# Patient Record
Sex: Female | Born: 1985
Health system: Southern US, Community
[De-identification: ages and names within clinical notes are randomized; demographics above are authoritative.]

## PROBLEM LIST (undated history)

## (undated) DIAGNOSIS — I10 Essential (primary) hypertension: Secondary | ICD-10-CM

## (undated) DIAGNOSIS — G43909 Migraine, unspecified, not intractable, without status migrainosus: Secondary | ICD-10-CM

## (undated) HISTORY — DX: Essential (primary) hypertension: I10

## (undated) HISTORY — DX: Migraine, unspecified, not intractable, without status migrainosus: G43.909

---

## 2011-04-16 ENCOUNTER — Emergency Department (HOSPITAL_COMMUNITY)
Admission: EM | Admit: 2011-04-16 | Discharge: 2011-04-17 | Disposition: A | Discharge status: Home / Self Care | Payer: BC Managed Care – HMO | Attending: Emergency Medicine | Admitting: Emergency Medicine

## 2011-04-16 DIAGNOSIS — B9689 Other specified bacterial agents as the cause of diseases classified elsewhere: Secondary | ICD-10-CM | POA: Insufficient documentation

## 2011-04-16 DIAGNOSIS — A499 Bacterial infection, unspecified: Secondary | ICD-10-CM | POA: Insufficient documentation

## 2011-04-16 DIAGNOSIS — N760 Acute vaginitis: Secondary | ICD-10-CM | POA: Insufficient documentation

## 2011-04-16 DIAGNOSIS — N72 Inflammatory disease of cervix uteri: Secondary | ICD-10-CM | POA: Insufficient documentation

## 2011-04-16 LAB — URINE MICROSCOPIC-ADD ON

## 2011-04-16 LAB — URINALYSIS, ROUTINE W REFLEX MICROSCOPIC
Glucose, UA: NEGATIVE mg/dL
Hgb urine dipstick: NEGATIVE
Ketones, ur: 15 mg/dL — AB
Nitrite: NEGATIVE
Protein, ur: 30 mg/dL — AB
Specific Gravity, Urine: 1.02 (ref 1.005–1.030)
Urobilinogen, UA: 2 mg/dL — ABNORMAL HIGH (ref 0.0–1.0)
pH: 8 (ref 5.0–8.0)

## 2011-04-16 LAB — POCT PREGNANCY, URINE: Preg Test, Ur: NEGATIVE

## 2011-04-17 LAB — URINE CULTURE
Colony Count: >=100000
Culture  Setup Time: 201204170119

## 2011-04-17 LAB — WET PREP, GENITAL
Trich, Wet Prep: NONE SEEN
Yeast Wet Prep HPF POC: NONE SEEN

## 2011-04-17 LAB — GC/CHLAMYDIA PROBE AMP, GENITAL
Chlamydia, DNA Probe: NEGATIVE
GC Probe Amp, Genital: NEGATIVE

## 2012-12-26 ENCOUNTER — Emergency Department (INDEPENDENT_AMBULATORY_CARE_PROVIDER_SITE_OTHER)
Admission: EM | Admit: 2012-12-26 | Discharge: 2012-12-26 | Disposition: A | Discharge status: Home / Self Care | Payer: BC Managed Care – PPO | Source: Home / Self Care | Attending: Emergency Medicine | Admitting: Emergency Medicine

## 2012-12-26 ENCOUNTER — Encounter (HOSPITAL_COMMUNITY): Payer: Self-pay

## 2012-12-26 DIAGNOSIS — G43909 Migraine, unspecified, not intractable, without status migrainosus: Secondary | ICD-10-CM

## 2012-12-26 MED ORDER — DEXAMETHASONE SODIUM PHOSPHATE 10 MG/ML IJ SOLN
10.0000 mg | Freq: Once | INTRAMUSCULAR | Status: AC
  Administered 2012-12-26: 10 mg via INTRAMUSCULAR

## 2012-12-26 MED ORDER — DIPHENHYDRAMINE HCL 50 MG/ML IJ SOLN
INTRAMUSCULAR | Status: AC
  Filled 2012-12-26: qty 1

## 2012-12-26 MED ORDER — SUMATRIPTAN SUCCINATE 50 MG PO TABS: 50.0000 mg | ORAL_TABLET | ORAL | Status: DC | PRN

## 2012-12-26 MED ORDER — DIPHENHYDRAMINE HCL 50 MG/ML IJ SOLN
25.0000 mg | Freq: Once | INTRAMUSCULAR | Status: AC
  Administered 2012-12-26: 25 mg via INTRAMUSCULAR

## 2012-12-26 MED ORDER — KETOROLAC TROMETHAMINE 60 MG/2ML IM SOLN
INTRAMUSCULAR | Status: AC
  Filled 2012-12-26: qty 2

## 2012-12-26 MED ORDER — DEXAMETHASONE SODIUM PHOSPHATE 10 MG/ML IJ SOLN
INTRAMUSCULAR | Status: AC
  Filled 2012-12-26: qty 1

## 2012-12-26 MED ORDER — KETOROLAC TROMETHAMINE 60 MG/2ML IM SOLN
60.0000 mg | Freq: Once | INTRAMUSCULAR | Status: AC
  Administered 2012-12-26: 60 mg via INTRAMUSCULAR

## 2012-12-26 MED ORDER — PREDNISONE 5 MG PO KIT: 1.0000 | PACK | Freq: Every day | ORAL | Status: DC

## 2012-12-26 NOTE — ED Provider Notes (Signed)
Chief Complaint  Patient presents with  . Headache    History of Present Illness:   The patient is a 26 year old female who has had a six-day history of bilateral, severe, throbbing headache rated 10 over 10 in intensity. This has been associated with nausea, vomiting, photophobia, and phonophobia, but not osmophobia. She has some visual spots but no diplopia or blurred vision. She feels achy all over and chills but denies any fever stiff neck. She's had no back pain. She has had headaches in the past but never been diagnosed with migraines. She denies any numbness, tingling, muscle weakness, difficulty with speech or ambulation. This headache came on after she ate some seasoned food that her boyfriend had prepared.  Review of Systems:  Other than noted above, the patient denies any of the following symptoms: Systemic:  No fever, chills, fatigue, photophobia, stiff neck. Eye:  No redness, eye pain, discharge, blurred vision, or diplopia. ENT:  No nasal congestion, rhinorrhea, sinus pressure or pain, sneezing, earache, or sore throat.  No jaw claudication. Neuro:  No paresthesias, loss of consciousness, seizure activity, muscle weakness, trouble with coordination or gait, trouble speaking or swallowing. Psych:  No depression, anxiety or trouble sleeping.  PMFSH:  Past medical history, family history, social history, meds, and allergies were reviewed.  Physical Exam:   Vital signs:  BP 130/88  Pulse 120  Temp 98.5 F (36.9 C) (Oral)  Resp 16  SpO2 99%  LMP 12/15/2012 General:  Alert and oriented.  In no distress. Eye:  Lids and conjunctivas normal.  PERRL,  Full EOMs.  Fundi benign with normal discs and vessels. ENT:  No cranial or facial tenderness to palpation.  TMs and canals clear.  Nasal mucosa was normal and uncongested without any drainage. No intra oral lesions, pharynx clear, mucous membranes moist, dentition normal. Neck:  Supple, full ROM, no tenderness to palpation.  No  adenopathy or mass. Neuro:  Alert and orented times 3.  Speech was clear, fluent, and appropriate.  Cranial nerves intact. No pronator drift, muscle strength normal. Finger to nose normal.  DTRs were 2+ and symmetrical.Station and gait were normal.  Romberg's sign was normal.  Able to perform tandem gait well. Psych:  Normal affect.  Medications given in [facility]:  She was given Decadron 10 mg IM, Toradol 60 mg IM, and Benadryl 25 mg IM. She tolerated this well without any immediate side effects.  Assessment:  The encounter diagnosis was Migraine headache.  Plan:   1.  The following meds were prescribed:   New Prescriptions   PREDNISONE 5 MG KIT    Take 1 kit (5 mg total) by mouth daily after breakfast. Prednisone 5 mg 6 day dosepack.  Take as directed.   SUMATRIPTAN (IMITREX) 50 MG TABLET    Take 1 tablet (50 mg total) by mouth every 2 (two) hours as needed for migraine.   2.  The patient was instructed in symptomatic care and handouts were given. 3.  The patient was told to return if becoming worse in any way, if no better in 3 or 4 days, and given some red flag symptoms that would indicate earlier return.    Reuben Likes, MD 12/26/12 2136

## 2012-12-26 NOTE — ED Notes (Signed)
HA for past 6 days, after eating seasoned food that her boyfriend prepared; no relief w OTC medications

## 2013-07-08 ENCOUNTER — Encounter: Payer: Self-pay | Admitting: Neurology

## 2013-07-08 ENCOUNTER — Ambulatory Visit (INDEPENDENT_AMBULATORY_CARE_PROVIDER_SITE_OTHER): Payer: BC Managed Care – PPO | Admitting: Neurology

## 2013-07-08 VITALS — BP 115/85 | HR 82 | Temp 98.3°F | Ht 59.0 in | Wt 153.0 lb

## 2013-07-08 DIAGNOSIS — G43509 Persistent migraine aura without cerebral infarction, not intractable, without status migrainosus: Secondary | ICD-10-CM

## 2013-07-08 DIAGNOSIS — R209 Unspecified disturbances of skin sensation: Secondary | ICD-10-CM

## 2013-07-08 DIAGNOSIS — G43909 Migraine, unspecified, not intractable, without status migrainosus: Secondary | ICD-10-CM

## 2013-07-08 DIAGNOSIS — R200 Anesthesia of skin: Secondary | ICD-10-CM

## 2013-07-08 HISTORY — DX: Migraine, unspecified, not intractable, without status migrainosus: G43.909

## 2013-07-08 MED ORDER — AMITRIPTYLINE HCL 25 MG PO TABS: ORAL_TABLET | ORAL | Status: DC

## 2013-07-08 NOTE — Progress Notes (Signed)
Subjective:    Patient ID: Jillian Sanchez is a 27 y.o. female.  HPI  Huston Foley, MD, PhD [facility] [address] [address] [place], [place] [address]  Dear Jillian Sanchez,   I saw your patient, Jillian Sanchez, upon your kind request in my neurologic clinic today for initial consultation of her right facial numbness. The patient is unaccompanied today. As you know, Jillian Sanchez is a very pleasant 27 year old right-handed woman with a benign medical history, who has been experiencing right facial numbness for the past few weeks. The numbness in constant and started in the R frontal are and mid face and now has included her R lateral neck area. No pain is reported, no paresthesias, no facial weakness. She does have a Hx of migraines, which started in 12/13 and treated with Imitrex and prednisone dose pack at the time. Interestingly, she reports having had a recurrent HA several days before the numbness started, and she had HA every other day. This started in mid-June. She has had no other one-sided weakness or numbness elsewhere. Never had TIA or stroke symptoms, denying sudden onset of one sided weakness, numbness, tingling, slurring of speech or droopy face, hearing loss, tinnitus, diplopia or visual field cut or monocular loss of vision, and denies recurrent headaches. She has no FHx of stroke or lupus and has one MA with migraines.   She reports a bilateral headache, which is described as intermittent and as a throbbing sensation. Associated with nausea, rare vomiting and accompanied by photo/sonophobia. She has been taking Excedrin for her HA. She had HA yesterday. She denies any additional stress. She has not been sleeping well.   Her Past Medical History Is Significant For: Past Medical History  Diagnosis Date  . Migraine, unspecified, without mention of intractable migraine without mention of status migrainosus 07/08/2013    Her Past Surgical History Is  Significant For: History reviewed. No pertinent past surgical history.  Her Family History Is Significant For: History reviewed. No pertinent family history.  Her Social History Is Significant For: History   Social History  . Marital Status: Single    Spouse Name: N/A    Number of Children: 0  . Years of Education: HS   Occupational History  .  Other    [employer]   Social History Main Topics  . Smoking status: Never Smoker   . Smokeless tobacco: Never Used  . Alcohol Use: Yes     Comment: socially  . Drug Use: No  . Sexually Active: None   Other Topics Concern  . None   Social History Narrative   Pt lives at home with friend.    Caffeine Use: 1 16oz soda daily.    Her Allergies Are:  No Known Allergies:   Her Current Medications Are:  Outpatient Encounter Prescriptions as of 07/08/2013  Medication Sig Dispense Refill  . Norethin Ace-Eth Estrad-FE (MINASTRIN 24 FE PO) Take 1 tablet by mouth daily.      . PredniSONE 5 MG KIT Take 1 kit (5 mg total) by mouth daily after breakfast. Prednisone 5 mg 6 day dosepack.  Take as directed.  1 kit  0  . SUMAtriptan (IMITREX) 50 MG tablet Take 1 tablet (50 mg total) by mouth every 2 (two) hours as needed for migraine.  10 tablet  0   No facility-administered encounter medications on file as of 07/08/2013.   Review of Systems  Neurological: Positive for numbness and headaches.  Psychiatric/Behavioral: Positive  for sleep disturbance (sleepiness).    Objective:  Neurologic Exam  Physical Exam Physical Examination:   Filed Vitals:   07/08/13 0953  BP: 115/85  Pulse: 82  Temp: 98.3 F (36.8 C)    General Examination: The patient is a very pleasant 27 y.o. female in no acute distress. She appears well-developed and well-nourished and well groomed.   HEENT: Normocephalic, atraumatic, pupils are equal, round and reactive to light and accommodation. Funduscopic exam is normal with sharp disc margins noted. Extraocular tracking  is good without limitation to gaze excursion or nystagmus noted. Normal smooth pursuit is noted. Hearing is grossly intact. Tympanic membranes are clear bilaterally. Face is symmetric with normal facial animation and normal facial strength, but she does have a known decrease in pinprick sensation, temperature sensation and light touch sensation in the entire right hemi-face as well as below her jaw line and into the right lateral neck area. Speech is clear with no dysarthria noted. There is no hypophonia. There is no lip, neck/head, jaw or voice tremor. Neck is supple with full range of passive and active motion. There are no carotid bruits on auscultation. Oropharynx exam reveals: mild mouth dryness, good dental hygiene and mild airway crowding, due to narrow airway. Mallampati is class II. Tongue protrudes centrally and palate elevates symmetrically.   Chest: Clear to auscultation without wheezing, rhonchi or crackles noted.  Heart: S1+S2+0, regular and normal without murmurs, rubs or gallops noted.   Abdomen: Soft, non-tender and non-distended with normal bowel sounds appreciated on auscultation.  Extremities: There is no pitting edema in the distal lower extremities bilaterally. Pedal pulses are intact.  Skin: Warm and dry without trophic changes noted. There are no varicose veins.  Musculoskeletal: exam reveals no obvious joint deformities, tenderness or joint swelling or erythema.   Neurologically:  Mental status: The patient is awake, alert and oriented in all 4 spheres. Her memory, attention, language and knowledge are appropriate. There is no aphasia, agnosia, apraxia or anomia. Speech is clear with normal prosody and enunciation. Thought process is linear. Mood is congruent and affect is normal.  Cranial nerves are as described above under HEENT exam. In addition, shoulder shrug is normal with equal shoulder height noted. Motor exam: Normal bulk, strength and tone is noted. There is no  drift, tremor or rebound. Romberg is negative. Reflexes are 2+ throughout. Toes are downgoing bilaterally. Fine motor skills are intact with normal finger taps, normal hand movements, normal rapid alternating patting, normal foot taps and normal foot agility.  Cerebellar testing shows no dysmetria or intention tremor on finger to nose testing. Heel to shin is unremarkable bilaterally. There is no truncal or gait ataxia.  Sensory exam is intact to light touch, pinprick, vibration, temperature sense and proprioception in the upper and lower extremities.  Gait, station and balance are unremarkable. No veering to one side is noted. No leaning to one side is noted. Posture is age-appropriate and stance is narrow based. No problems turning are noted. She turns en bloc. Tandem walk is unremarkable. Intact toe and heel stance is noted.               Assessment and Plan:   Assessment and Plan:  In summary, Jillian Sanchez is a very pleasant 27 y.o.-year old female with a history of migraine headaches. Her physical exam is non-focal, with the exception of mild sensory deficit of her right hemi-face in the V1 through V3 area. No weakness is detected. She has been having recurrent  migrainous headaches. Most likely this is a migraine related phenomenon. Some people have migraine auras in the absence of the associated headaches and she has been having recurrent migraine attacks recently. Nevertheless, at this time I would like to go ahead with a brain MRI with and without contrast and additional blood work. For her recurrent headaches and numbness I would like to try her on Elavil starting at 12.5 mg each night. We will gradually bring this up to up to 50 mg each night. I did caution her about potential side effects including sedation and mouth dryness, confusion. She understood and was in agreement. I will see her back in about 3 months, sooner if the need arises, depending on her test results as well. She is advised that  we will be calling her for her test results and I provided her with a new prescription for Elavil.   Thank you very much for allowing me to participate in the care of this nice patient. If I can be of any further assistance to you please do not hesitate to call me at [phone].  Sincerely,   Huston Foley, MD, PhD

## 2013-07-08 NOTE — Patient Instructions (Addendum)
I think overall you are doing fairly well but I do want to suggest a few things today:  Remember to drink plenty of fluid, eat healthy meals and do not skip any meals. Try to eat protein with a every meal and eat a healthy snack such as fruit or nuts in between meals. Try to keep a regular sleep-wake schedule and try to exercise daily, particularly in the form of walking, 20-30 minutes a day, if you can.   Engage in social activities in your community and with your family and try to keep up with current events by reading the newspaper or watching the news.   As far as your medications are concerned, I would like to suggest trial of Elavil (amitriptyline) 25 mg: Take half a pill daily at bedtime for one week, then one pill daily at bedtime for one week, then one and a half pills daily at bedtime for one week, then 2 pills daily at bedtime thereafter. Common side effects reported are: mouth dryness, drowsiness, confusion, dizziness.   As far as diagnostic testing: MRI brain with and without contrast. We will also do blood work today.  I would like to see you back in 3 months, sooner if we need to. Please call us with any interim questions, concerns, problems, updates or refill requests.  Please also call us for any test results so we can go over those with you on the phone. Brett Canales is my clinical assistant and will answer any of your questions and relay your messages to me and also relay most of my messages to you.  Our phone number is [phone]. We also have an after hours call service for urgent matters and there is a physician on-call for urgent questions. For any emergencies you know to call 911 or go to the nearest emergency room.

## 2013-07-09 LAB — COMPREHENSIVE METABOLIC PANEL
ALT: 46 IU/L — ABNORMAL HIGH (ref 0–32)
AST: 30 IU/L (ref 0–40)
Albumin/Globulin Ratio: 1.7 (ref 1.1–2.5)
Albumin: 4.5 g/dL (ref 3.5–5.5)
Alkaline Phosphatase: 70 IU/L (ref 39–117)
BUN/Creatinine Ratio: 12 (ref 8–20)
BUN: 12 mg/dL (ref 6–20)
CO2: 25 mmol/L (ref 18–29)
Calcium: 9 mg/dL (ref 8.7–10.2)
Chloride: 100 mmol/L (ref 97–108)
Creatinine, Ser: 1.01 mg/dL — ABNORMAL HIGH (ref 0.57–1.00)
GFR calc Af Amer: 89 mL/min/{1.73_m2} (ref >59)
GFR calc non Af Amer: 77 mL/min/{1.73_m2} (ref >59)
Globulin, Total: 2.7 g/dL (ref 1.5–4.5)
Glucose: 72 mg/dL (ref 65–99)
Potassium: 4.3 mmol/L (ref 3.5–5.2)
Sodium: 139 mmol/L (ref 134–144)
Total Bilirubin: 0.4 mg/dL (ref 0.0–1.2)
Total Protein: 7.2 g/dL (ref 6.0–8.5)

## 2013-07-09 LAB — VITAMIN D 25 HYDROXY (VIT D DEFICIENCY, FRACTURES): Vit D, 25-Hydroxy: 6.3 ng/mL — ABNORMAL LOW (ref 30.0–100.0)

## 2013-07-09 LAB — B12 AND FOLATE PANEL
Folate: 6.3 ng/mL (ref >3.0)
Vitamin B-12: 389 pg/mL (ref 211–946)

## 2013-07-09 LAB — C-REACTIVE PROTEIN: CRP: 3 mg/L (ref 0.0–4.9)

## 2013-07-09 LAB — ANA W/REFLEX: Anti Nuclear Antibody(ANA): NEGATIVE

## 2013-07-09 LAB — SEDIMENTATION RATE: Sed Rate: 3 mm/hr (ref 0–32)

## 2013-07-09 LAB — HGB A1C W/O EAG: Hgb A1c MFr Bld: 5.4 % (ref 4.8–5.6)

## 2013-07-09 LAB — TSH: TSH: 1.09 u[IU]/mL (ref 0.450–4.500)

## 2013-07-13 ENCOUNTER — Telehealth: Payer: Self-pay

## 2013-07-13 NOTE — Progress Notes (Signed)
Quick Note:  Please call and advise the patient that the recent labs we checked were within normal limits, except for low vitamin D level. Please have her discuss with her PCP which supplement to take for her Vitamin D, her level was 6.3 and the low level for normal is 30. Otherwise, blood sugar, electrolytes, kidney function, liver function, thyroid function, inflammatory markers, sedimentation rate, were normals with the exception of one of her liver enzymes called ALT mildly elevated. This is a nonspecific finding and may indicate being on chronic medicines. No further action is required on these tests at this time other than addressing the low vitamin D. Please remind patient to keep any upcoming appointments or tests and to call us with any interim questions, concerns, problems or updates. Thanks,  Huston Foley, MD, PhD    ______

## 2013-07-13 NOTE — Telephone Encounter (Signed)
I called patient and left VM that her labs were normal except her vitamin D. She can speak with her primary doctor about what supplement he recommends. I left info on what her lab was and what normal is. I also left message that ALT is slightly abnormal but that is seen in patients who are on medication for a long time and is not of concern for Dr. Frances Furbish. I requested that patient call and speak with a nurse if she has any questions or concerns [phone]

## 2013-07-13 NOTE — Telephone Encounter (Signed)
Message copied by [facility] on Mon Jul 13, 2013  3:21 PM ------      Message from: Huston Foley      Created: Mon Jul 13, 2013 10:48 AM       Please call and advise the patient that the recent labs we checked were within normal limits, except for low vitamin D level. Please have her discuss with her PCP which supplement to take for her Vitamin D, her level was 6.3 and the low level for normal is 30. Otherwise, blood sugar, electrolytes, kidney function, liver function, thyroid function, inflammatory markers, sedimentation rate, were normals with the exception of one of her liver enzymes called ALT mildly elevated. This is a nonspecific finding and may indicate being on chronic medicines. No further action is required on these tests at this time other than addressing the low vitamin D. Please remind patient to keep any upcoming appointments or tests and to call us with any interim questions, concerns, problems or updates. Thanks,       Huston Foley, MD, PhD                   ------

## 2013-07-23 ENCOUNTER — Other Ambulatory Visit: Payer: BC Managed Care – PPO

## 2013-07-30 ENCOUNTER — Ambulatory Visit (INDEPENDENT_AMBULATORY_CARE_PROVIDER_SITE_OTHER): Payer: BC Managed Care – PPO

## 2013-07-30 DIAGNOSIS — R209 Unspecified disturbances of skin sensation: Secondary | ICD-10-CM

## 2013-07-30 DIAGNOSIS — G43509 Persistent migraine aura without cerebral infarction, not intractable, without status migrainosus: Secondary | ICD-10-CM

## 2013-07-30 DIAGNOSIS — R200 Anesthesia of skin: Secondary | ICD-10-CM

## 2013-07-30 DIAGNOSIS — G43909 Migraine, unspecified, not intractable, without status migrainosus: Secondary | ICD-10-CM

## 2013-07-30 MED ORDER — GADOPENTETATE DIMEGLUMINE 469.01 MG/ML IV SOLN: 14.0000 mL | Freq: Once | INTRAVENOUS | Status: AC | PRN

## 2013-07-31 NOTE — Progress Notes (Signed)
Quick Note:  Please call and advise the patient that the recent scan we did was within normal limits. We did a brain MRI with and without contrast, which showed normal findings. In particular, there were no acute findings, such as a stroke, or mass or blood products. No further action is required on this test at this time. Please remind patient to keep any upcoming appointments or tests and to call us with any interim questions, concerns, problems or updates. Thanks,  Koray Soter, MD, PhD   ______ 

## 2013-07-31 NOTE — Progress Notes (Signed)
Quick Note:  Left a message on the pt's home voice mail (vm was in the patient's voice and she stated her name) regarding her recent MRI being within normal limits. Contact information was given so that she may call with any questions or concerns.   ______

## 2013-10-14 ENCOUNTER — Ambulatory Visit: Payer: BC Managed Care – PPO | Admitting: Neurology

## 2014-01-05 ENCOUNTER — Encounter (HOSPITAL_COMMUNITY): Payer: Self-pay | Admitting: Emergency Medicine

## 2014-01-05 ENCOUNTER — Emergency Department (HOSPITAL_COMMUNITY)
Admission: EM | Admit: 2014-01-05 | Discharge: 2014-01-05 | Disposition: A | Discharge status: Home / Self Care | Payer: BC Managed Care – PPO | Source: Home / Self Care | Attending: Family Medicine | Admitting: Family Medicine

## 2014-01-05 DIAGNOSIS — J020 Streptococcal pharyngitis: Secondary | ICD-10-CM

## 2014-01-05 LAB — POCT RAPID STREP A: Streptococcus, Group A Screen (Direct): POSITIVE — AB

## 2014-01-05 MED ORDER — METHYLPREDNISOLONE (PAK) 4 MG PO TABS: ORAL_TABLET | ORAL | Status: DC

## 2014-01-05 MED ORDER — TRAMADOL HCL 50 MG PO TABS: 50.0000 mg | ORAL_TABLET | Freq: Four times a day (QID) | ORAL | Status: DC | PRN

## 2014-01-05 MED ORDER — AMOXICILLIN 875 MG PO TABS: 875.0000 mg | ORAL_TABLET | Freq: Two times a day (BID) | ORAL | Status: DC

## 2014-01-05 NOTE — ED Notes (Signed)
Pt reports having "flu like symptoms" since Sunday. Pt denies fever, nausea, or vomiting.

## 2014-01-05 NOTE — ED Provider Notes (Signed)
CSN: [id number]     Arrival date & time 01/05/14  1112 History   First MD Initiated Contact with Patient 01/05/14 1313     Chief Complaint  Patient presents with  . Influenza   (Consider location/radiation/quality/duration/timing/severity/associated sxs/prior Treatment) HPI Comments: 28 year old female presents complaining of possible influenza. Since Sunday 2 days ago, she has had a Severe sore throat, body aches, headache, runny nose, mild cough. The sore throat has been worsening and is now very severe. She also admits to some mild nausea without vomiting. She says the cough is very mild, dry, infrequent, and started only yesterday. She has no recent travel or sick contacts.   Past Medical History  Diagnosis Date  . Migraine, unspecified, without mention of intractable migraine without mention of status migrainosus 07/08/2013   History reviewed. No pertinent past surgical history. History reviewed. No pertinent family history. History  Substance Use Topics  . Smoking status: Never Smoker   . Smokeless tobacco: Never Used  . Alcohol Use: Yes     Comment: socially   OB History   Grav Para Term Preterm Abortions TAB SAB Ect Mult Living                 Review of Systems  Constitutional: Positive for fever, chills and fatigue.  HENT: Positive for congestion, rhinorrhea and sore throat.   Eyes: Negative for visual disturbance.  Respiratory: Positive for cough. Negative for shortness of breath.   Cardiovascular: Negative for chest pain, palpitations and leg swelling.  Gastrointestinal: Positive for nausea. Negative for vomiting and abdominal pain.  Endocrine: Negative for polydipsia and polyuria.  Genitourinary: Negative for dysuria, urgency and frequency.  Musculoskeletal: Positive for arthralgias and myalgias.  Skin: Negative for rash.  Neurological: Positive for headaches. Negative for dizziness, weakness and light-headedness.    Allergies  Review of patient's allergies  indicates no known allergies.  Home Medications   Current Outpatient Rx  Name  Route  Sig  Dispense  Refill  . amitriptyline (ELAVIL) 25 MG tablet      1/2 pill each bedtime x 1 week, then 1 pill nightly x 1 week, then 1 1/2 pills nightly x 1 week, then 2 pills nightly thereafter.   60 tablet   3   . amoxicillin (AMOXIL) 875 MG tablet   Oral   Take 1 tablet (875 mg total) by mouth 2 (two) times daily.   14 tablet   0   . methylPREDNIsolone (MEDROL DOSPACK) 4 MG tablet      follow package directions   21 tablet   0   . Norethin Ace-Eth Estrad-FE (MINASTRIN 24 FE PO)   Oral   Take 1 tablet by mouth daily.         . PredniSONE 5 MG KIT   Oral   Take 1 kit (5 mg total) by mouth daily after breakfast. Prednisone 5 mg 6 day dosepack.  Take as directed.   1 kit   0   . SUMAtriptan (IMITREX) 50 MG tablet   Oral   Take 1 tablet (50 mg total) by mouth every 2 (two) hours as needed for migraine.   10 tablet   0   . traMADol (ULTRAM) 50 MG tablet   Oral   Take 1 tablet (50 mg total) by mouth every 6 (six) hours as needed.   15 tablet   0    BP 136/81  Pulse 94  Temp(Src) 98 F (36.7 C) (Oral)  Resp 18  SpO2 100%  LMP 12/19/2013 Physical Exam  Nursing note and vitals reviewed. Constitutional: She is oriented to person, place, and time. Vital signs are normal. She appears well-developed and well-nourished. No distress.  HENT:  Head: Normocephalic and atraumatic.  Nose: Nose normal. Right sinus exhibits no maxillary sinus tenderness and no frontal sinus tenderness. Left sinus exhibits no maxillary sinus tenderness and no frontal sinus tenderness.  Mouth/Throat: Uvula is midline and mucous membranes are normal. Posterior oropharyngeal erythema present. No oropharyngeal exudate, posterior oropharyngeal edema or tonsillar abscesses.  Neck: Normal range of motion. Neck supple.  Pulmonary/Chest: Effort normal. No respiratory distress.  Lymphadenopathy:    She has  cervical adenopathy (tonsillar and posterior cervical).  Neurological: She is alert and oriented to person, place, and time. She has normal strength. Coordination normal.  Skin: Skin is warm and dry. No rash noted. She is not diaphoretic.  Psychiatric: She has a normal mood and affect. Judgment normal.    ED Course  Procedures (including critical care time) Labs Review Labs Reviewed  POCT RAPID STREP A ([facility] CARE ONLY) - Abnormal; Notable for the following:    Streptococcus, Group A Screen (Direct) POSITIVE (*)    All other components within normal limits   Imaging Review No results found.    MDM   1. Strep pharyngitis    strep positive.  Treat with amoxicillin and symptomatic management. Followup if not improving within a few days  Meds ordered this encounter  Medications  . amoxicillin (AMOXIL) 875 MG tablet    Sig: Take 1 tablet (875 mg total) by mouth 2 (two) times daily.    Dispense:  14 tablet    Refill:  0    Order Specific Question:  Supervising Provider    Answer:  Jake Michaelis, DAVID C [id number]  . methylPREDNIsolone (MEDROL DOSPACK) 4 MG tablet    Sig: follow package directions    Dispense:  21 tablet    Refill:  0    Order Specific Question:  Supervising Provider    Answer:  Jake Michaelis, DAVID C [id number]  . traMADol (ULTRAM) 50 MG tablet    Sig: Take 1 tablet (50 mg total) by mouth every 6 (six) hours as needed.    Dispense:  15 tablet    Refill:  0    Order Specific Question:  Supervising Provider    Answer:  Jake Michaelis, DAVID C [place], PA-C 01/07/14 [phone]

## 2014-01-05 NOTE — Discharge Instructions (Signed)
Sore Throat A sore throat is pain, burning, irritation, or scratchiness of the throat. There is often pain or tenderness when swallowing or talking. A sore throat may be accompanied by other symptoms, such as coughing, sneezing, fever, and swollen neck glands. A sore throat is often the first sign of another sickness, such as a cold, flu, strep throat, or mononucleosis (commonly known as mono). Most sore throats go away without medical treatment. CAUSES  The most common causes of a sore throat include:  A viral infection, such as a cold, flu, or mono.  A bacterial infection, such as strep throat, tonsillitis, or whooping cough.  Seasonal allergies.  Dryness in the air.  Irritants, such as smoke or pollution.  Gastroesophageal reflux disease (GERD). HOME CARE INSTRUCTIONS   Only take over-the-counter medicines as directed by your caregiver.  Drink enough fluids to keep your urine clear or pale yellow.  Rest as needed.  Try using throat sprays, lozenges, or sucking on hard candy to ease any pain (if older than 4 years or as directed).  Sip warm liquids, such as broth, herbal tea, or warm water with honey to relieve pain temporarily. You may also eat or drink cold or frozen liquids such as frozen ice pops.  Gargle with salt water (mix 1 tsp salt with 8 oz of water).  Do not smoke and avoid secondhand smoke.  Put a cool-mist humidifier in your bedroom at night to moisten the air. You can also turn on a hot shower and sit in the bathroom with the door closed for 5 10 minutes. SEEK IMMEDIATE MEDICAL CARE IF:  You have difficulty breathing.  You are unable to swallow fluids, soft foods, or your saliva.  You have increased swelling in the throat.  Your sore throat does not get better in 7 days.  You have nausea and vomiting.  You have a fever or persistent symptoms for more than 2 3 days.  You have a fever and your symptoms suddenly get worse. MAKE SURE YOU:   Understand  these instructions.  Will watch your condition.  Will get help right away if you are not doing well or get worse. Document Released: 01/24/2005 Document Revised: 12/03/2012 Document Reviewed: 08/24/2012 ExitCare Patient Information 2014 [employer].  

## 2014-01-09 NOTE — ED Provider Notes (Signed)
Medical screening examination/treatment/procedure(s) were performed by a resident physician or non-physician practitioner and as the supervising physician I was immediately available for consultation/collaboration.  Fernando Torry, MD    Ewa Hipp S Robet Crutchfield, MD 01/09/14 0849 

## 2014-06-22 ENCOUNTER — Other Ambulatory Visit (HOSPITAL_COMMUNITY)
Admission: RE | Admit: 2014-06-22 | Discharge: 2014-06-22 | Disposition: A | Discharge status: Home / Self Care | Payer: BC Managed Care – PPO | Source: Ambulatory Visit | Attending: Internal Medicine | Admitting: Internal Medicine

## 2014-06-22 ENCOUNTER — Other Ambulatory Visit: Payer: Self-pay | Admitting: Internal Medicine

## 2014-06-22 DIAGNOSIS — Z01419 Encounter for gynecological examination (general) (routine) without abnormal findings: Secondary | ICD-10-CM | POA: Insufficient documentation

## 2014-06-24 LAB — CYTOLOGY - PAP

## 2015-03-30 ENCOUNTER — Emergency Department (HOSPITAL_COMMUNITY)
Admission: EM | Admit: 2015-03-30 | Discharge: 2015-03-30 | Disposition: A | Discharge status: Home / Self Care | Payer: BLUE CROSS/BLUE SHIELD | Source: Home / Self Care | Attending: Family Medicine | Admitting: Family Medicine

## 2015-03-30 ENCOUNTER — Encounter (HOSPITAL_COMMUNITY): Payer: Self-pay | Admitting: Emergency Medicine

## 2015-03-30 DIAGNOSIS — N3001 Acute cystitis with hematuria: Secondary | ICD-10-CM

## 2015-03-30 LAB — POCT URINALYSIS DIP (DEVICE)
Bilirubin Urine: NEGATIVE
Glucose, UA: NEGATIVE mg/dL
Ketones, ur: NEGATIVE mg/dL
Nitrite: NEGATIVE
Protein, ur: NEGATIVE mg/dL
Specific Gravity, Urine: 1.015 (ref 1.005–1.030)
Urobilinogen, UA: 1 mg/dL (ref 0.0–1.0)
pH: 7 (ref 5.0–8.0)

## 2015-03-30 LAB — POCT PREGNANCY, URINE: Preg Test, Ur: NEGATIVE

## 2015-03-30 MED ORDER — CEPHALEXIN 500 MG PO CAPS: 500.0000 mg | ORAL_CAPSULE | Freq: Two times a day (BID) | ORAL | Status: DC

## 2015-03-30 NOTE — ED Provider Notes (Signed)
Jillian Sanchez is a 29 y.o. female who presents to Urgent Care today for urinary frequency urgency and dysuria associated with urine odor. Symptoms present for 3-4 days. No fevers chills nausea vomiting or diarrhea. No treatment tried yet.   Past Medical History  Diagnosis Date  . Migraine, unspecified, without mention of intractable migraine without mention of status migrainosus 07/08/2013   History reviewed. No pertinent past surgical history. History  Substance Use Topics  . Smoking status: Never Smoker   . Smokeless tobacco: Never Used  . Alcohol Use: Yes     Comment: socially   ROS as above Medications: No current facility-administered medications for this encounter.   Current Outpatient Prescriptions  Medication Sig Dispense Refill  . amitriptyline (ELAVIL) 25 MG tablet 1/2 pill each bedtime x 1 week, then 1 pill nightly x 1 week, then 1 1/2 pills nightly x 1 week, then 2 pills nightly thereafter. 60 tablet 3  . amoxicillin (AMOXIL) 875 MG tablet Take 1 tablet (875 mg total) by mouth 2 (two) times daily. 14 tablet 0  . cephALEXin (KEFLEX) 500 MG capsule Take 1 capsule (500 mg total) by mouth 2 (two) times daily. 14 capsule 0  . methylPREDNIsolone (MEDROL DOSPACK) 4 MG tablet follow package directions 21 tablet 0  . Norethin Ace-Eth Estrad-FE (MINASTRIN 24 FE PO) Take 1 tablet by mouth daily.    . PredniSONE 5 MG KIT Take 1 kit (5 mg total) by mouth daily after breakfast. Prednisone 5 mg 6 day dosepack.  Take as directed. 1 kit 0  . SUMAtriptan (IMITREX) 50 MG tablet Take 1 tablet (50 mg total) by mouth every 2 (two) hours as needed for migraine. 10 tablet 0  . traMADol (ULTRAM) 50 MG tablet Take 1 tablet (50 mg total) by mouth every 6 (six) hours as needed. 15 tablet 0   No Known Allergies   Exam:  BP 155/94 mmHg  Pulse 92  Temp(Src) 98.3 F (36.8 C) (Oral)  Resp 16  SpO2 98%  LMP 03/30/2015 Gen: Well NAD HEENT: EOMI,  MMM Lungs: Normal work of breathing. CTABL Heart:  RRR no MRG Abd: NABS, Soft. Nondistended, Nontender no CV angle tenderness to percussion Exts: Brisk capillary refill, warm and well perfused.   Results for orders placed or performed during the hospital encounter of 03/30/15 (from the past 24 hour(s))  POCT urinalysis dip (device)     Status: Abnormal   Collection Time: 03/30/15  5:38 PM  Result Value Ref Range   Glucose, UA NEGATIVE NEGATIVE mg/dL   Bilirubin Urine NEGATIVE NEGATIVE   Ketones, ur NEGATIVE NEGATIVE mg/dL   Specific Gravity, Urine 1.015 1.005 - 1.030   Hgb urine dipstick MODERATE (A) NEGATIVE   pH 7.0 5.0 - 8.0   Protein, ur NEGATIVE NEGATIVE mg/dL   Urobilinogen, UA 1.0 0.0 - 1.0 mg/dL   Nitrite NEGATIVE NEGATIVE   Leukocytes, UA SMALL (A) NEGATIVE   No results found.  Assessment and Plan: 29 y.o. female with urinary tract infection. Treat with Keflex.  Discussed warning signs or symptoms. Please see discharge instructions. Patient expresses understanding.     Gregor Hams, MD 03/30/15 [phone]

## 2015-03-30 NOTE — Discharge Instructions (Signed)
Thank you for coming in today. If your belly pain worsens, or you have high fever, bad vomiting, blood in your stool or black tarry stool go to the Emergency Room.   Urinary Tract Infection Urinary tract infections (UTIs) can develop anywhere along your urinary tract. Your urinary tract is your body's drainage system for removing wastes and extra water. Your urinary tract includes two kidneys, two ureters, a bladder, and a urethra. Your kidneys are a pair of bean-shaped organs. Each kidney is about the size of your fist. They are located below your ribs, one on each side of your spine. CAUSES Infections are caused by microbes, which are microscopic organisms, including fungi, viruses, and bacteria. These organisms are so small that they can only be seen through a microscope. Bacteria are the microbes that most commonly cause UTIs. SYMPTOMS  Symptoms of UTIs may vary by age and gender of the patient and by the location of the infection. Symptoms in young women typically include a frequent and intense urge to urinate and a painful, burning feeling in the bladder or urethra during urination. Older women and men are more likely to be tired, shaky, and weak and have muscle aches and abdominal pain. A fever may mean the infection is in your kidneys. Other symptoms of a kidney infection include pain in your back or sides below the ribs, nausea, and vomiting. DIAGNOSIS To diagnose a UTI, your caregiver will ask you about your symptoms. Your caregiver also will ask to provide a urine sample. The urine sample will be tested for bacteria and white blood cells. White blood cells are made by your body to help fight infection. TREATMENT  Typically, UTIs can be treated with medication. Because most UTIs are caused by a bacterial infection, they usually can be treated with the use of antibiotics. The choice of antibiotic and length of treatment depend on your symptoms and the type of bacteria causing your  infection. HOME CARE INSTRUCTIONS  If you were prescribed antibiotics, take them exactly as your caregiver instructs you. Finish the medication even if you feel better after you have only taken some of the medication.  Drink enough water and fluids to keep your urine clear or pale yellow.  Avoid caffeine, tea, and carbonated beverages. They tend to irritate your bladder.  Empty your bladder often. Avoid holding urine for long periods of time.  Empty your bladder before and after sexual intercourse.  After a bowel movement, women should cleanse from front to back. Use each tissue only once. SEEK MEDICAL CARE IF:   You have back pain.  You develop a fever.  Your symptoms do not begin to resolve within 3 days. SEEK IMMEDIATE MEDICAL CARE IF:   You have severe back pain or lower abdominal pain.  You develop chills.  You have nausea or vomiting.  You have continued burning or discomfort with urination. MAKE SURE YOU:   Understand these instructions.  Will watch your condition.  Will get help right away if you are not doing well or get worse. Document Released: 09/26/2005 Document Revised: 06/17/2012 Document Reviewed: 01/25/2012 ExitCare Patient Information 2015 [employer]. This information is not intended to replace advice given to you by your health care provider. Make sure you discuss any questions you have with your health care provider.  

## 2015-03-30 NOTE — ED Notes (Signed)
C/o cold sx onset 3 days Sx include dysuria, cloudy urine, urinary frequency/urgency, foul odor, abd pain Denies fevers, chills Alert, no signs of acute distress.

## 2017-02-04 DIAGNOSIS — L2084 Intrinsic (allergic) eczema: Secondary | ICD-10-CM | POA: Diagnosis not present

## 2017-05-17 DIAGNOSIS — Z01419 Encounter for gynecological examination (general) (routine) without abnormal findings: Secondary | ICD-10-CM | POA: Diagnosis not present

## 2017-05-17 DIAGNOSIS — Z124 Encounter for screening for malignant neoplasm of cervix: Secondary | ICD-10-CM | POA: Diagnosis not present

## 2018-03-25 DIAGNOSIS — Z23 Encounter for immunization: Secondary | ICD-10-CM | POA: Diagnosis not present

## 2018-03-25 DIAGNOSIS — Z0000 Encounter for general adult medical examination without abnormal findings: Secondary | ICD-10-CM | POA: Diagnosis not present

## 2018-05-24 DIAGNOSIS — M5431 Sciatica, right side: Secondary | ICD-10-CM | POA: Diagnosis not present

## 2018-05-28 ENCOUNTER — Ambulatory Visit
Admission: RE | Admit: 2018-05-28 | Discharge: 2018-05-28 | Disposition: A | Discharge status: Home / Self Care | Payer: 59 | Source: Ambulatory Visit | Attending: Internal Medicine | Admitting: Internal Medicine

## 2018-05-28 ENCOUNTER — Other Ambulatory Visit: Payer: Self-pay | Admitting: Internal Medicine

## 2018-05-28 DIAGNOSIS — M62830 Muscle spasm of back: Secondary | ICD-10-CM | POA: Diagnosis not present

## 2018-05-28 DIAGNOSIS — M5431 Sciatica, right side: Secondary | ICD-10-CM

## 2018-06-10 DIAGNOSIS — K5909 Other constipation: Secondary | ICD-10-CM | POA: Diagnosis not present

## 2018-10-30 ENCOUNTER — Other Ambulatory Visit (HOSPITAL_COMMUNITY)
Admission: RE | Admit: 2018-10-30 | Discharge: 2018-10-30 | Disposition: A | Discharge status: Home / Self Care | Payer: 59 | Source: Ambulatory Visit | Attending: Internal Medicine | Admitting: Internal Medicine

## 2018-10-30 ENCOUNTER — Other Ambulatory Visit: Payer: Self-pay | Admitting: Internal Medicine

## 2018-10-30 DIAGNOSIS — R102 Pelvic and perineal pain: Secondary | ICD-10-CM | POA: Diagnosis not present

## 2018-10-30 DIAGNOSIS — Z01411 Encounter for gynecological examination (general) (routine) with abnormal findings: Secondary | ICD-10-CM | POA: Insufficient documentation

## 2018-10-30 DIAGNOSIS — Z01419 Encounter for gynecological examination (general) (routine) without abnormal findings: Secondary | ICD-10-CM | POA: Diagnosis not present

## 2018-10-30 DIAGNOSIS — N949 Unspecified condition associated with female genital organs and menstrual cycle: Secondary | ICD-10-CM | POA: Diagnosis not present

## 2018-10-30 DIAGNOSIS — N926 Irregular menstruation, unspecified: Secondary | ICD-10-CM | POA: Diagnosis not present

## 2018-10-30 DIAGNOSIS — Z23 Encounter for immunization: Secondary | ICD-10-CM | POA: Diagnosis not present

## 2018-11-04 LAB — CYTOLOGY - PAP
Adequacy: ABSENT
Diagnosis: NEGATIVE
HPV 16/18/45 genotyping: NEGATIVE
HPV: DETECTED — AB

## 2019-09-28 ENCOUNTER — Ambulatory Visit: Payer: Self-pay | Admitting: Adult Health

## 2019-09-28 ENCOUNTER — Other Ambulatory Visit: Payer: Self-pay

## 2020-02-19 ENCOUNTER — Ambulatory Visit: Payer: 59 | Attending: Internal Medicine

## 2020-02-19 DIAGNOSIS — Z23 Encounter for immunization: Secondary | ICD-10-CM

## 2020-02-19 NOTE — Progress Notes (Signed)
.    Covid-19 Vaccination Clinic  Name:  Jillian Sanchez    MRN: [id number] DOB: 1986/09/15  02/19/2020  [name] was observed post Covid-19 immunization for 15 minutes without incidence. She was provided with Vaccine Information Sheet and instruction to access the V-Safe system.   [name] was instructed to call 911 with any severe reactions post vaccine: Marland Kitchen Difficulty breathing  . Swelling of your face and throat  . A fast heartbeat  . A bad rash all over your body  . Dizziness and weakness    Immunizations Administered    Name Date Dose VIS Date Route   Pfizer COVID-19 Vaccine 02/19/2020  4:54 PM 0.3 mL 12/11/2019 Intramuscular   Manufacturer: [employer], [employer]   Lot: [id number]   NDC: [address]-5

## 2020-03-15 ENCOUNTER — Ambulatory Visit: Payer: 59 | Attending: Internal Medicine

## 2020-03-15 DIAGNOSIS — Z23 Encounter for immunization: Secondary | ICD-10-CM

## 2020-03-15 NOTE — Progress Notes (Signed)
   Covid-19 Vaccination Clinic  Name:  Jillian Sanchez    MRN: [id number] DOB: 11-28-1986  03/15/2020  [name] was observed post Covid-19 immunization for 15 minutes without incident. She was provided with Vaccine Information Sheet and instruction to access the V-Safe system.   [name] was instructed to call 911 with any severe reactions post vaccine: Marland Kitchen Difficulty breathing  . Swelling of face and throat  . A fast heartbeat  . A bad rash all over body  . Dizziness and weakness   Immunizations Administered    Name Date Dose VIS Date Route   Pfizer COVID-19 Vaccine 03/15/2020  4:41 PM 0.3 mL 12/11/2019 Intramuscular   Manufacturer: [employer], [employer]   Lot: [id number]   NDC: [address]-9

## 2020-04-08 IMAGING — CR DG LUMBAR SPINE COMPLETE 4+V
6 series · 6 of 6 positions shown · non-contrast
Comparison: None.

CLINICAL DATA: Sciatica on the right

EXAM:
LUMBAR SPINE - COMPLETE 4+ VIEW

[t l-spine a.p.]
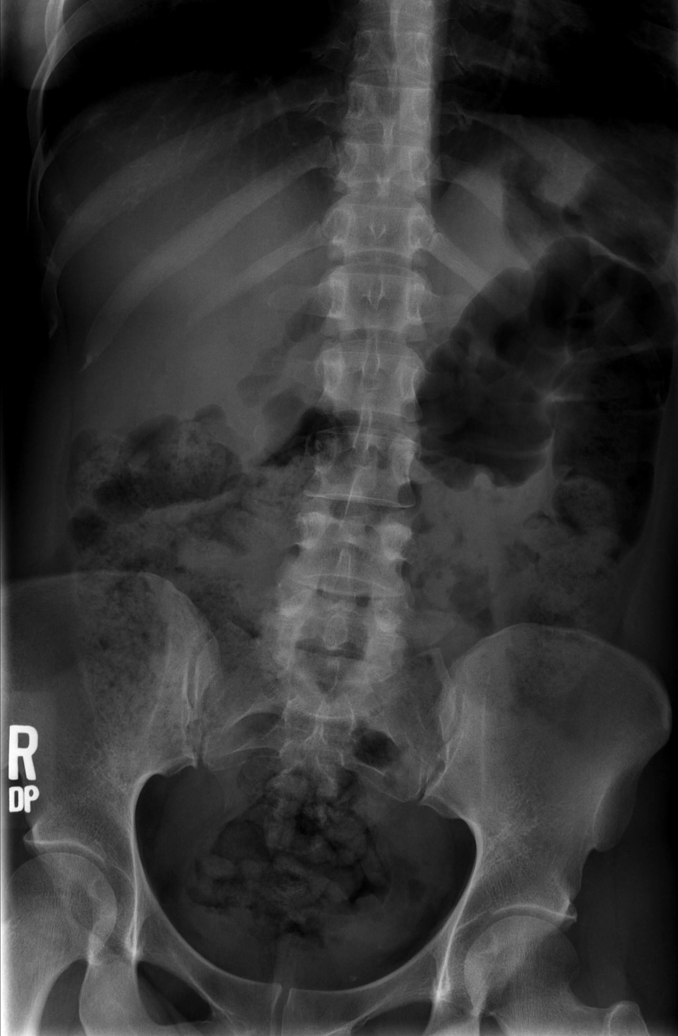

[t l-spine oblique exposure (1 of 3)]
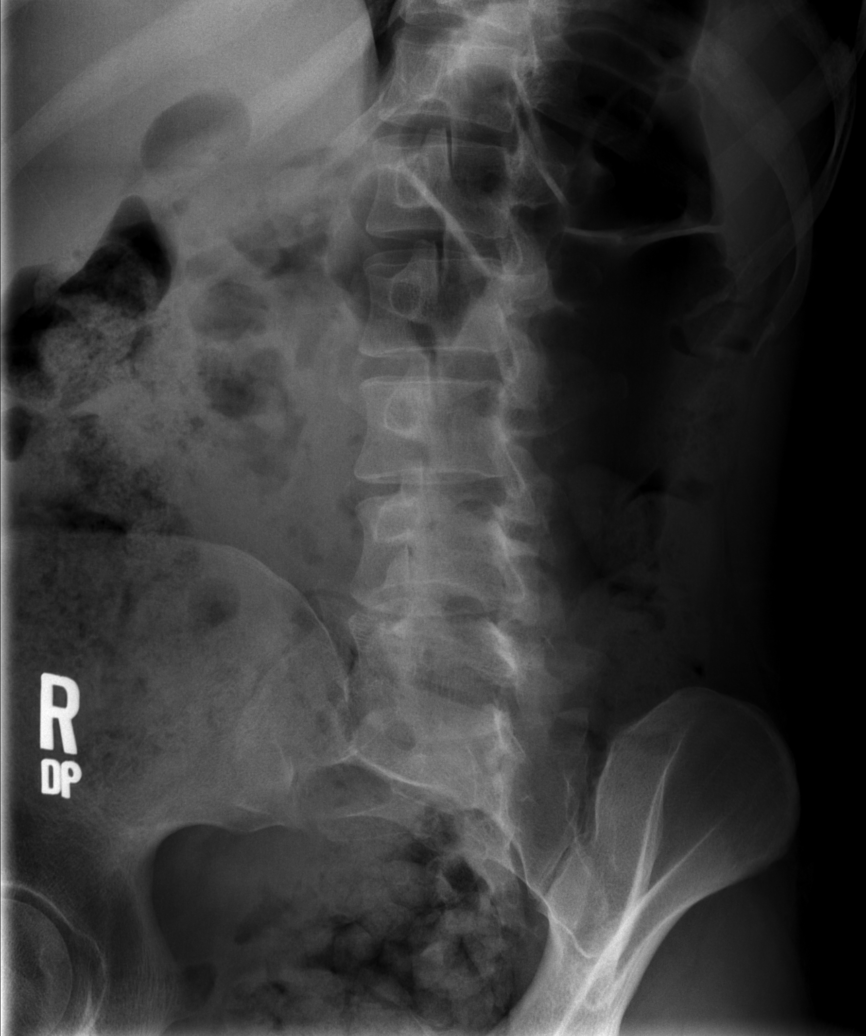

[t l-spine oblique exposure (2 of 3)]
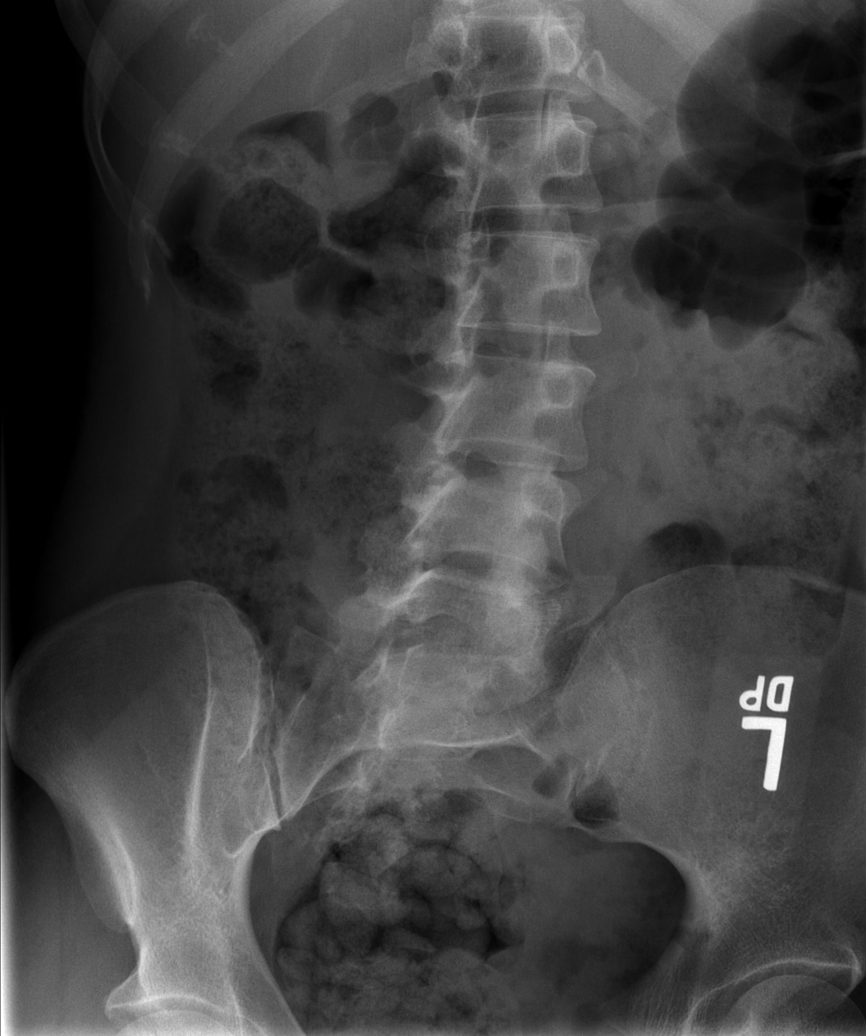

[t l-spine oblique exposure (3 of 3)]
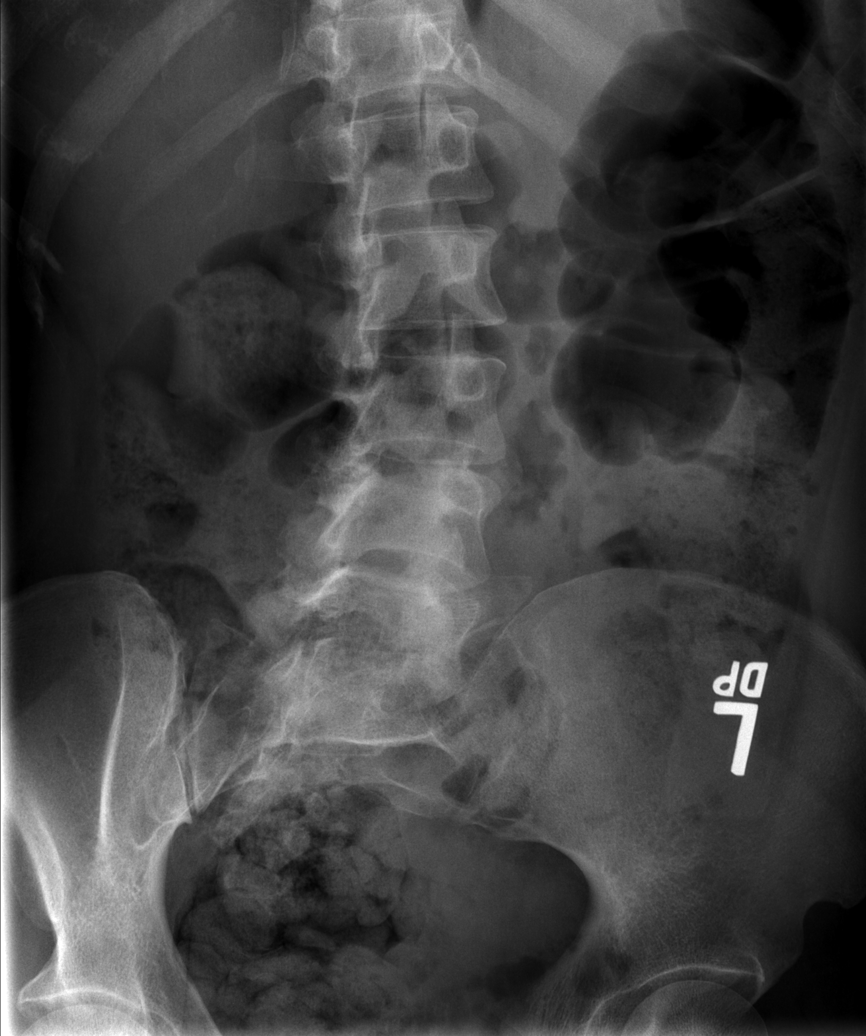

[t l-spine lat]
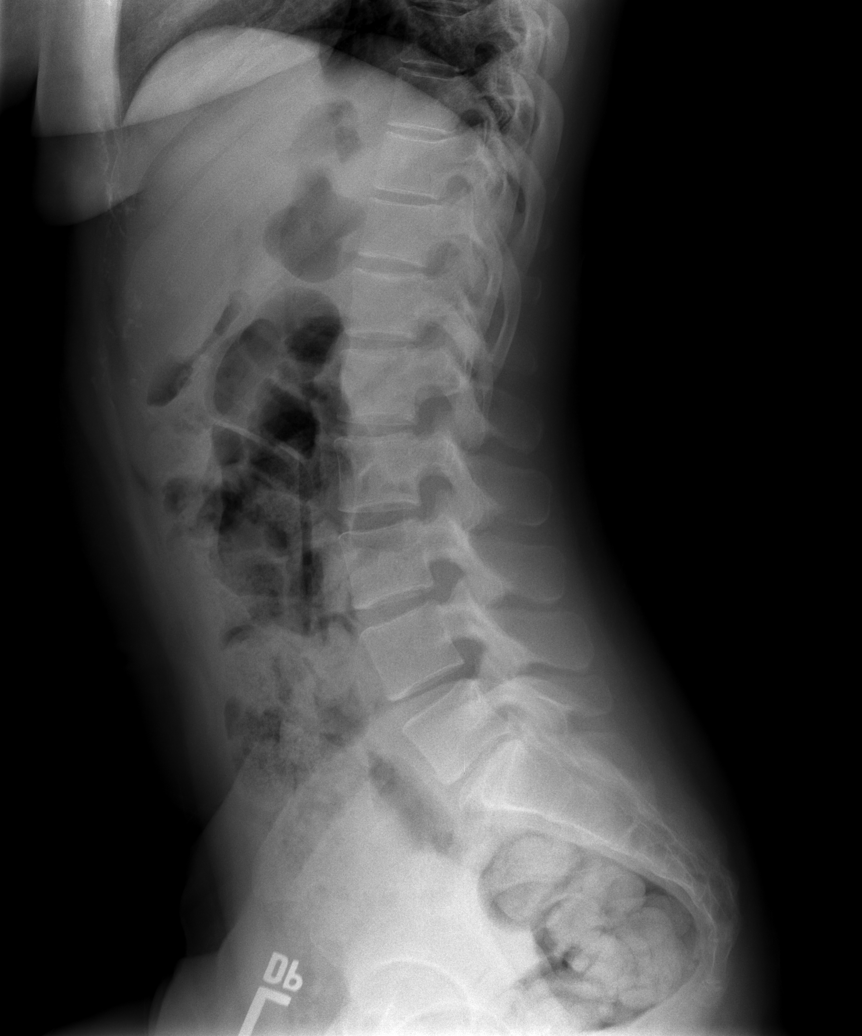

[t l-spine l5-s1 spot]
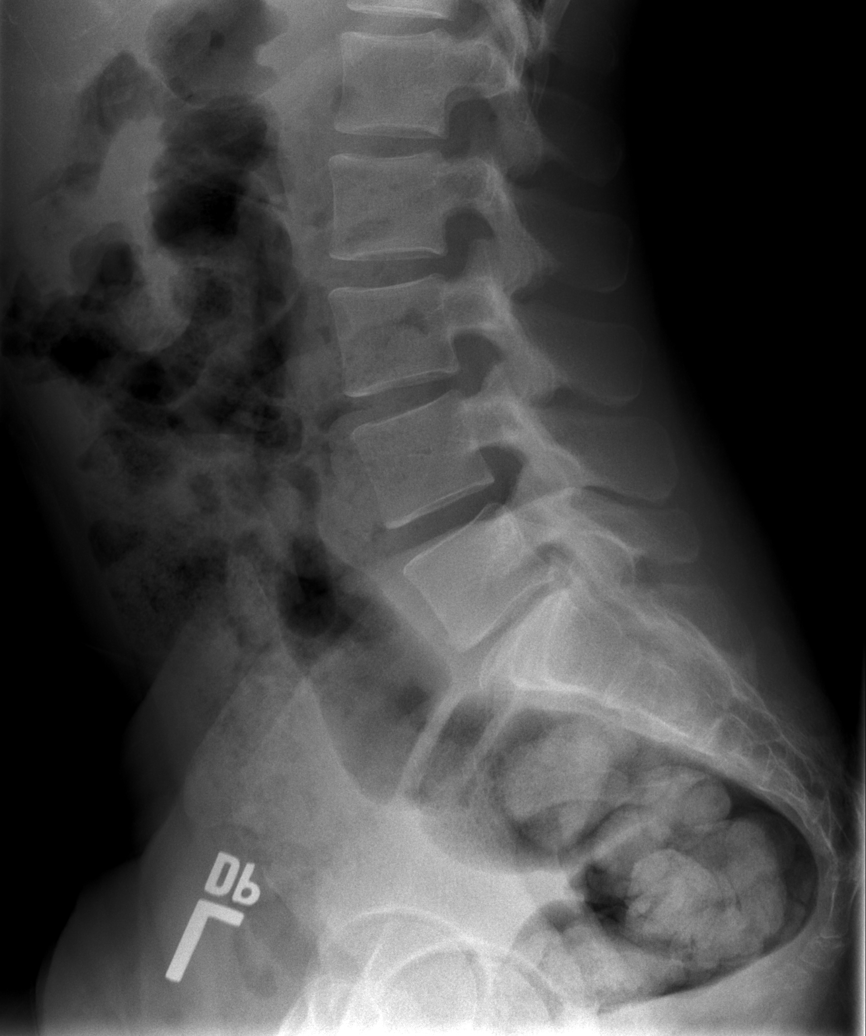

[6 of 6 positions shown; findings below may reference images not displayed]

FINDINGS: Five lumbar type vertebral bodies.

Narrow L5-S1 disc which may be developmental, given absence of
endplate spurring, versus degenerative. Negative for facet spurring.

AP and oblique views are limited by stool, which is extensive
through the colon.
IMPRESSION: 1. No acute finding.
2. Narrow L5-S1 disc which may be developmental in the absence of
endplate spurring.
3. Incidental prominent stool volume.

## 2020-09-02 ENCOUNTER — Ambulatory Visit: Payer: 59 | Admitting: Family Medicine

## 2020-10-19 ENCOUNTER — Ambulatory Visit (INDEPENDENT_AMBULATORY_CARE_PROVIDER_SITE_OTHER): Payer: 59 | Admitting: Family Medicine

## 2020-10-19 ENCOUNTER — Other Ambulatory Visit: Payer: Self-pay

## 2020-10-19 ENCOUNTER — Encounter: Payer: Self-pay | Admitting: Family Medicine

## 2020-10-19 VITALS — BP 138/70 | HR 102 | Temp 99.6°F | Ht 61.0 in | Wt 151.8 lb

## 2020-10-19 DIAGNOSIS — Z3041 Encounter for surveillance of contraceptive pills: Secondary | ICD-10-CM | POA: Diagnosis not present

## 2020-10-19 DIAGNOSIS — F430 Acute stress reaction: Secondary | ICD-10-CM

## 2020-10-19 DIAGNOSIS — L732 Hidradenitis suppurativa: Secondary | ICD-10-CM

## 2020-10-19 DIAGNOSIS — I10 Essential (primary) hypertension: Secondary | ICD-10-CM

## 2020-10-19 DIAGNOSIS — F410 Panic disorder [episodic paroxysmal anxiety] without agoraphobia: Secondary | ICD-10-CM | POA: Diagnosis not present

## 2020-10-19 MED ORDER — CEPHALEXIN 500 MG PO CAPS: 500.0000 mg | ORAL_CAPSULE | Freq: Two times a day (BID) | ORAL | 0 refills | Status: DC

## 2020-10-19 MED ORDER — ALPRAZOLAM 0.5 MG PO TABS: 0.2500 mg | ORAL_TABLET | Freq: Every day | ORAL | 0 refills | Status: DC | PRN

## 2020-10-19 MED ORDER — SERTRALINE HCL 100 MG PO TABS: 100.0000 mg | ORAL_TABLET | Freq: Every day | ORAL | 3 refills | Status: DC

## 2020-10-19 NOTE — Patient Instructions (Signed)
Please return in 6-8 weeks for recheck blood pressure and mood.  Please increase your sertraline to 17m daily. I've sent in a prescription to your mail order pharmacy. You may increase your current dose by taking 2 tablets daily now of the 50 mg tabs.   You may use xanax as needed for panic symptoms.  Please contact your EAP program to help manage your work related stress.  Please check your blood pressure at home at variable times and log them for me.  Please send me FMLA paperwork to complete.   It was a pleasure meeting you today! Thank you for choosing [facility] meet your healthcare needs! I truly look forward to working with you. If you have any questions or concerns, please send me a message via My[facility] or call the office at [phone]   Stress, Adult Stress is a normal reaction to life events. Stress is what you feel when life demands more than you are used to, or more than you think you can handle. Some stress can be useful, such as studying for a test or meeting a deadline at work. Stress that occurs too often or for too long can cause problems. It can affect your emotional health and interfere with relationships and normal daily activities. Too much stress can weaken your body's defense system (immune system) and increase your risk for physical illness. If you already have a medical problem, stress can make it worse. What are the causes? All sorts of life events can cause stress. An event that causes stress for one person may not be stressful for another person. Major life events, whether positive or negative, commonly cause stress. Examples include:  Losing a job or starting a new job.  Losing a loved one.  Moving to a new town or home.  Getting married or divorced.  Having a baby.  Getting injured or sick. Less obvious life events can also cause stress, especially if they occur day after day or in combination with each other. Examples include:  Working long  hours.  Driving in traffic.  Caring for children.  Being in debt.  Being in a difficult relationship. What are the signs or symptoms? Stress can cause emotional symptoms, including:  Anxiety. This is feeling worried, afraid, on edge, overwhelmed, or out of control.  Anger, including irritation or impatience.  Depression. This is feeling sad, down, helpless, or guilty.  Trouble focusing, remembering, or making decisions. Stress can cause physical symptoms, including:  Aches and pains. These may affect your head, neck, back, stomach, or other areas of your body.  Tight muscles or a clenched jaw.  Low energy.  Trouble sleeping. Stress can cause unhealthy behaviors, including:  Eating to feel better (overeating) or skipping meals.  Working too much or putting off tasks.  Smoking, drinking alcohol, or using drugs to feel better. How is this diagnosed? Stress is diagnosed through an assessment by your health care provider. He or she may diagnose this condition based on:  Your symptoms and any stressful life events.  Your medical history.  Tests to rule out other causes of your symptoms. Depending on your condition, your health care provider may refer you to a specialist for further evaluation. How is this treated?  Stress management techniques are the recommended treatment for stress. Medicine is not typically recommended for the treatment of stress. Techniques to reduce your reaction to stressful life events include:  Stress identification. Monitor yourself for symptoms of stress and identify what causes stress for  you. These skills may help you to avoid or prepare for stressful events.  Time management. Set your priorities, keep a calendar of events, and learn to say no. Taking these actions can help you avoid making too many commitments. Techniques for coping with stress include:  Rethinking the problem. Try to think realistically about stressful events rather than  ignoring them or overreacting. Try to find the positives in a stressful situation rather than focusing on the negatives.  Exercise. Physical exercise can release both physical and emotional tension. The key is to find a form of exercise that you enjoy and do it regularly.  Relaxation techniques. These relax the body and mind. The key is to find one or more that you enjoy and use the techniques regularly. Examples include: ? Meditation, deep breathing, or progressive relaxation techniques. ? Yoga or tai chi. ? Biofeedback, mindfulness techniques, or journaling. ? Listening to music, being out in nature, or participating in other hobbies.  Practicing a healthy lifestyle. Eat a balanced diet, drink plenty of water, limit or avoid caffeine, and get plenty of sleep.  Having a strong support network. Spend time with family, friends, or other people you enjoy being around. Express your feelings and talk things over with someone you trust. Counseling or talk therapy with a mental health professional may be helpful if you are having trouble managing stress on your own. Follow these instructions at home: Lifestyle   Avoid drugs.  Do not use any products that contain nicotine or tobacco, such as cigarettes, e-cigarettes, and chewing tobacco. If you need help quitting, ask your health care provider.  Limit alcohol intake to no more than 1 drink a day for nonpregnant women and 2 drinks a day for men. One drink equals 12 oz of beer, 5 oz of wine, or 1 oz of hard liquor  Do not use alcohol or drugs to relax.  Eat a balanced diet that includes fresh fruits and vegetables, whole grains, lean meats, fish, eggs, and beans, and low-fat dairy. Avoid processed foods and foods high in added fat, sugar, and salt.  Exercise at least 30 minutes on 5 or more days each week.  Get 7-8 hours of sleep each night. General instructions   Practice stress management techniques as discussed with your health care  provider.  Drink enough fluid to keep your urine clear or pale yellow.  Take over-the-counter and prescription medicines only as told by your health care provider.  Keep all follow-up visits as told by your health care provider. This is important. Contact a health care provider if:  Your symptoms get worse.  You have new symptoms.  You feel overwhelmed by your problems and can no longer manage them on your own. Get help right away if:  You have thoughts of hurting yourself or others. If you ever feel like you may hurt yourself or others, or have thoughts about taking your own life, get help right away. You can go to your nearest emergency department or call:  Your local emergency services (911 in the [place]  A suicide crisis helpline, such as the [place] at 530-178-4669. This is open 24 hours a day. Summary  Stress is a normal reaction to life events. It can cause problems if it happens too often or for too long.  Practicing stress management techniques is the best way to treat stress.  Counseling or talk therapy with a mental health professional may be helpful if you are having trouble managing stress  on your own. This information is not intended to replace advice given to you by your health care provider. Make sure you discuss any questions you have with your health care provider. Document Revised: 07/17/2019 Document Reviewed: 02/06/2017 Elsevier Patient Education  [employer].

## 2020-10-19 NOTE — Progress Notes (Signed)
Subjective  CC:  Chief Complaint  Patient presents with  . New Patient (Initial Visit)  . Hypertension    Concerned about her blood pressure. She thinks it may be related to stress due to her job(under staffed, no time off)  . Insomnia    Issues with staying asleep. Thinks it may be related to work stress.     HPI: Jillian Sanchez is a 34 y.o. female who presents to [facility] today to establish care with me as a new patient.   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  She sees gynecology and Pap smears are up-to-date.  Back in June she was diagnosed with essential hypertension started on low-dose beta-blocker.  Feels her blood pressures are improved.  Her main concern is stress.  She reports she is now suffering from stress reaction/mood symptoms including panic attacks and depressive symptoms.  She reports this is all due to stress at work.  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  She is having trouble taking time off when she does not feel well.  She denies history of mood disorder.  She was started on Zoloft 50 mg about 2 months ago and feels that it has helped slightly.  She has not set up counseling.  No suicidal ideation.  Her symptoms cause trouble with concentration, headaches, chest palpitations, dizziness.  She is also concerned that her blood pressure may be out of control.  No chest pain.  She takes oral contraceptives.  She does not have children.  Right axilla with pustule that has been draining and sore.  Started about 3 days ago.  No fevers.  No prior history.  Assessment  1. Stress reaction   2. Panic attacks   3. Essential hypertension   4. Oral contraceptive use   5.  Hydradenitis      Plan   Stress reaction: Counseling done.  Moderate to severe.  Increase Zoloft 200 mg daily.  Add Xanax as needed.  Recommend EPA program.  Recommend FMLA for stress reaction for intermittently over the next 2 to 6 weeks.  Monitor, recheck 6 to 12 weeks  Hypertension: Borderline control now.  Patient to start checking home readings.  Likely elevated due to current stressed state.  Continue beta-blocker.  Continue all contraceptives  Hidradenitis: Start antibiotics.  Reviewed old records.  Follow up:  No follow-ups on file. No orders of the defined types were placed in this encounter.  Meds ordered this encounter  Medications  . sertraline (ZOLOFT) 100 MG tablet    Sig: Take 1 tablet (100 mg total) by mouth daily.    Dispense:  90 tablet    Refill:  3  . ALPRAZolam (XANAX) 0.5 MG tablet    Sig: Take 0.5-1 tablets (0.25-0.5 mg total) by mouth daily as needed for anxiety or sleep.    Dispense:  30 tablet    Refill:  0  . cephALEXin (KEFLEX) 500 MG capsule    Sig: Take 1 capsule (500 mg total) by mouth 2 (two) times daily.    Dispense:  14 capsule    Refill:  0     Depression screen PHQ 2/9 10/19/2020  Decreased Interest 2  Down, Depressed,  Hopeless 2  PHQ - 2 Score 4  Altered sleeping 3  Tired, decreased energy 3  Change in appetite 2  Feeling bad or failure about yourself  0  Trouble concentrating 2  Moving slowly or fidgety/restless 0  PHQ-9 Score 14   GAD 7 : Generalized Anxiety Score 10/19/2020  Nervous, Anxious, on Edge 3  Control/stop worrying 3  Worry too much - different things 3  Trouble relaxing 3  Restless 3  Easily annoyed or irritable 3  Afraid - awful might happen 3  Total GAD 7 Score 21  Anxiety Difficulty Very difficult      We updated and reviewed the patient's past history in detail and it is documented below.  Patient Active Problem List   Diagnosis Date Noted  . Oral contraceptive use 10/19/2020  . Migraine,  unspecified, without mention of intractable migraine without mention of status migrainosus 07/08/2013   Health Maintenance  Topic Date Due  . Hepatitis C Screening  Never done  . HIV Screening  Never done  . TETANUS/TDAP  Never done  . INFLUENZA VACCINE  Never done  . PAP SMEAR-Modifier  10/30/2021  . COVID-19 Vaccine  Completed   Immunization History  Administered Date(s) Administered  . PFIZER SARS-COV-2 Vaccination 02/19/2020, 03/15/2020   Current Meds  Medication Sig  . AUROVELA FE 1/20 1-20 MG-MCG tablet Take 1 tablet by mouth at bedtime.  . metoprolol succinate (TOPROL-XL) 25 MG 24 hr tablet Take 25 mg by mouth at bedtime.  . sertraline (ZOLOFT) 100 MG tablet Take 1 tablet (100 mg total) by mouth daily.  . SUMAtriptan (IMITREX) 50 MG tablet Take 1 tablet (50 mg total) by mouth every 2 (two) hours as needed for migraine.  . [DISCONTINUED] sertraline (ZOLOFT) 50 MG tablet Take 50 mg by mouth daily.    Allergies: Patient has No Known Allergies. Past Medical History Patient  has a past medical history of Migraine, unspecified, without mention of intractable migraine without mention of status migrainosus (07/08/2013). Past Surgical History Patient  has no past surgical history on file. Family History: Patient family history is not on file. Social History:  Patient  reports that she has never smoked. She has never used smokeless tobacco. She reports current alcohol use. She reports that she does not use drugs.  Review of Systems: Constitutional: negative for fever or malaise Ophthalmic: negative for photophobia, double vision or loss of vision Cardiovascular: negative for chest pain, dyspnea on exertion, or new LE swelling Respiratory: negative for SOB or persistent cough Gastrointestinal: negative for abdominal pain, change in bowel habits or melena Genitourinary: negative for dysuria or gross hematuria Musculoskeletal: negative for new gait disturbance or muscular  weakness Integumentary: negative for new or persistent rashes Neurological: negative for TIA or stroke symptoms Psychiatric: negative for SI or delusions Allergic/Immunologic: negative for hives  Patient Care Team    Relationship Specialty Notifications Start End  Willow Ora, MD PCP - General Family Medicine  10/19/20     Objective  Vitals: BP 138/70   Pulse (!) 102   Temp 99.6 F (37.6 C) (Temporal)   Ht 5\' 1"  (1.549 m)   Wt 151 lb 12.8 oz (68.9 kg)   SpO2 98%   BMI 28.68 kg/m  General:  Well developed, well nourished, no acute distress  Psych:  Alert and oriented, anxious and at times agitated mood and affect HEENT:  Normocephalic, atraumatic, non-icteric sclera, supple neck without adenopathy, mass or thyromegaly Cardiovascular:  RRR without gallop, rub or  murmur Respiratory:  Good breath sounds bilaterally, CTAB with normal respiratory effort Gastrointestinal: normal bowel sounds, soft, non-tender, no noted masses. No HSM MSK: no deformities, contusions. Joints are without erythema or swelling     Commons side effects, risks, benefits, and alternatives for medications and treatment plan prescribed today were discussed, and the patient expressed understanding of the given instructions. Patient is instructed to call or message via MyChart if he/she has any questions or concerns regarding our treatment plan. No barriers to understanding were identified. We discussed Red Flag symptoms and signs in detail. Patient expressed understanding regarding what to do in case of urgent or emergency type symptoms.   Medication list was reconciled, printed and provided to the patient in AVS. Patient instructions and summary information was reviewed with the patient as documented in the AVS. This note was prepared with assistance of Dragon voice recognition software. Occasional wrong-word or sound-a-like substitutions may have occurred due to the inherent limitations of voice recognition  software  This visit occurred during the SARS-CoV-2 public health emergency.  Safety protocols were in place, including screening questions prior to the visit, additional usage of staff PPE, and extensive cleaning of exam room while observing appropriate contact time as indicated for disinfecting solutions.

## 2020-11-02 ENCOUNTER — Telehealth: Payer: Self-pay

## 2020-11-02 NOTE — Telephone Encounter (Signed)
Pt called asking if we have received a fax for her BP and FMLA. Please advise.

## 2020-11-04 NOTE — Telephone Encounter (Signed)
LMOVM advising patient I have not received any faxes

## 2020-11-07 NOTE — Telephone Encounter (Signed)
Forms completed

## 2020-11-07 NOTE — Telephone Encounter (Signed)
Error

## 2020-11-07 NOTE — Telephone Encounter (Signed)
I have received paperwork, placed in your sign folder

## 2020-11-07 NOTE — Telephone Encounter (Signed)
Forms faxed

## 2020-11-17 ENCOUNTER — Telehealth: Payer: Self-pay | Admitting: Family Medicine

## 2020-11-17 NOTE — Telephone Encounter (Signed)
LMOVM advising patient to return my call  

## 2020-11-17 NOTE — Telephone Encounter (Signed)
Reviewed home monitoring chart: please see scanned in data. I've placed form on your desk. diastolics consistently >90 on toprol xl 25 daily.   Please call patient; please increase toprol xl to 50mg  daily.  Please order the larger pill for her.   Continue home monitoring.  Follow up as directed.  12/09/2020

## 2020-11-17 NOTE — Telephone Encounter (Signed)
Pt returned call

## 2020-11-18 ENCOUNTER — Other Ambulatory Visit: Payer: Self-pay

## 2020-11-18 MED ORDER — METOPROLOL SUCCINATE ER 50 MG PO TB24: 50.0000 mg | ORAL_TABLET | Freq: Every day | ORAL | 3 refills | Status: AC

## 2020-11-18 NOTE — Telephone Encounter (Signed)
Spoke with patient, aware of new medication being sent into pharmacy.

## 2020-12-09 ENCOUNTER — Encounter: Payer: Self-pay | Admitting: Family Medicine

## 2020-12-09 ENCOUNTER — Other Ambulatory Visit: Payer: Self-pay

## 2020-12-09 ENCOUNTER — Ambulatory Visit: Payer: 59 | Admitting: Family Medicine

## 2020-12-09 VITALS — BP 140/100 | HR 92 | Temp 98.7°F | Ht 61.0 in | Wt 152.6 lb

## 2020-12-09 DIAGNOSIS — Z3041 Encounter for surveillance of contraceptive pills: Secondary | ICD-10-CM | POA: Diagnosis not present

## 2020-12-09 DIAGNOSIS — F430 Acute stress reaction: Secondary | ICD-10-CM | POA: Diagnosis not present

## 2020-12-09 DIAGNOSIS — I10 Essential (primary) hypertension: Secondary | ICD-10-CM

## 2020-12-09 DIAGNOSIS — F410 Panic disorder [episodic paroxysmal anxiety] without agoraphobia: Secondary | ICD-10-CM

## 2020-12-09 MED ORDER — HYDROCHLOROTHIAZIDE 25 MG PO TABS: 25.0000 mg | ORAL_TABLET | Freq: Every day | ORAL | 3 refills | Status: AC

## 2020-12-09 NOTE — Patient Instructions (Signed)
Please return in 3 months to recheck your blood pressure and mood.   I've added HCTZ; take this every morning with your zoloft. Continue the toprol xl at night.  Keep checking your blood pressures at home. Let me know if they do not get to < 140/90 consistently.  Get to the counselor for therapy as well.  Glad you are feeling better.   If you have any questions or concerns, please don't hesitate to send me a message via MyChart or call the office at [phone]. Thank you for visiting with [place] today! It's our pleasure caring for you.

## 2020-12-09 NOTE — Progress Notes (Signed)
Subjective    CC:  Chief Complaint  Patient presents with  . Hypertension    140-146/90's at home reading   . Stress    Has noticed an improvement with increase in Zoloft. Is in the process if getting therapy thru [facility]  . Ear Pain    Right ear    HPI: Jillian Sanchez is a 33 y.o. female who presents to the office today to address the problems listed above in the chief complaint.  Hypertension f/u: Control is poor. Pt reports she is doing well. taking medications as instructed, no medication side effects noted, no TIAs, no chest pain on exertion, no dyspnea on exertion, no swelling of ankles.  She is taking the beta-blocker but blood pressures are still elevated.  Work stressors are improving.  Denies adverse effects from his BP medications. Compliance with medication is good.   Panic attacks due to stress reaction: Zoloft has improved her ability to cope with the stress at work.  She is feeling much better.  No adverse effects.  Having sadness though she still scores highly on both depression and anxiety screens.  She has intermittent FMLA.  She is going to see a counselor to help further manage the symptoms.  Depression screen [facility] 2/9 12/09/2020 10/19/2020  Decreased Interest - 2  Down, Depressed, Hopeless 3 2  PHQ - 2 Score 3 4  Altered sleeping 3 3  Tired, decreased energy 3 3  Change in appetite 3 2  Feeling bad or failure about yourself  3 0  Trouble concentrating 3 2  Moving slowly or fidgety/restless 2 0  Suicidal thoughts 0 -  PHQ-9 Score 20 14  Difficult doing work/chores Very difficult -   GAD 7 : Generalized Anxiety Score 12/09/2020 10/19/2020  Nervous, Anxious, on Edge 3 3  Control/stop worrying 3 3  Worry too much - different things 3 3  Trouble relaxing 3 3  Restless 3 3  Easily annoyed or irritable 3 3  Afraid - awful might happen 1 3  Total GAD 7 Score 19 21  Anxiety Difficulty Very difficult Very difficult      Assessment  1. Essential hypertension    2. Panic attacks   3. Stress reaction      Plan    Hypertension f/u: BP control is poorly controlled.  We will add HCTZ to Toprol-XL for better blood pressure control.  Patient will monitor at home.  She will follow-up if uncontrolled, otherwise recheck 3 months  Adjustment anxiety/depression and panic attacks f/u: Counseling done.  Improved overall by patient's report.  Continue current medications.  Recommend counseling. Education regarding management of these chronic disease states was given. Management strategies discussed on successive visits include dietary and exercise recommendations, goals of achieving and maintaining IBW, and lifestyle modifications aiming for adequate sleep and minimizing stressors.   Follow up: 3 months for blood pressure and mood follow-up  No orders of the defined types were placed in this encounter.  No orders of the defined types were placed in this encounter.     BP Readings from Last 3 Encounters:  12/09/20 (!) 140/100  10/19/20 138/70  03/30/15 155/94   Wt Readings from Last 3 Encounters:  12/09/20 152 lb 9.6 oz (69.2 kg)  10/19/20 151 lb 12.8 oz (68.9 kg)  07/08/13 153 lb (69.4 kg)    No results found for: CHOL No results found for: HDL No results found for: LDLCALC No results found for: TRIG No results found for: CHOLHDL  No results found for: LDLDIRECT Lab Results  Component Value Date   CREATININE 1.01 (H) 07/08/2013   BUN 12 07/08/2013   NA 139 07/08/2013   K 4.3 07/08/2013   CL 100 07/08/2013   CO2 25 07/08/2013    The ASCVD Risk score Denman George DC Jr., et al., 2013) failed to calculate for the following reasons:   The 2013 ASCVD risk score is only valid for ages 58 to 79  I reviewed the patients updated PMH, FH, and SocHx.    Patient Active Problem List   Diagnosis Date Noted  . Essential hypertension 12/09/2020  . Panic attacks 12/09/2020  . Oral contraceptive use 10/19/2020  . Migraine, unspecified, without mention of  intractable migraine without mention of status migrainosus 07/08/2013    Allergies: Patient has no known allergies.  Social History: Patient  reports that she has never smoked. She has never used smokeless tobacco. She reports current alcohol use. She reports that she does not use drugs.  Current Meds  Medication Sig  . ALPRAZolam (XANAX) 0.5 MG tablet Take 0.5-1 tablets (0.25-0.5 mg total) by mouth daily as needed for anxiety or sleep.  Ronie Spies FE 1/20 1-20 MG-MCG tablet Take 1 tablet by mouth at bedtime.  . metoprolol succinate (TOPROL-XL) 50 MG 24 hr tablet Take 1 tablet (50 mg total) by mouth daily. Take with or immediately following a meal.  . sertraline (ZOLOFT) 100 MG tablet Take 1 tablet (100 mg total) by mouth daily.    Review of Systems: Cardiovascular: negative for chest pain, palpitations, leg swelling, orthopnea Respiratory: negative for SOB, wheezing or persistent cough Gastrointestinal: negative for abdominal pain Genitourinary: negative for dysuria or gross hematuria  Objective  Vitals: BP (!) 140/100   Pulse 92   Temp 98.7 F (37.1 C) (Temporal)   Ht 5\' 1"  (1.549 m)   Wt 152 lb 9.6 oz (69.2 kg)   SpO2 98%   BMI 28.83 kg/m  General: no acute distress  Psych:  Alert and oriented, normal mood and affect HEENT:  Normocephalic, atraumatic, supple neck  Cardiovascular:  RRR without murmur. no edema Respiratory:  Good breath sounds bilaterally, CTAB with normal respiratory effort Skin:  Warm, no rashes Neurologic:   Mental status is normal  Commons side effects, risks, benefits, and alternatives for medications and treatment plan prescribed today were discussed, and the patient expressed understanding of the given instructions. Patient is instructed to call or message via MyChart if he/she has any questions or concerns regarding our treatment plan. No barriers to understanding were identified. We discussed Red Flag symptoms and signs in detail. Patient expressed  understanding regarding what to do in case of urgent or emergency type symptoms.   Medication list was reconciled, printed and provided to the patient in AVS. Patient instructions and summary information was reviewed with the patient as documented in the AVS. This note was prepared with assistance of Dragon voice recognition software. Occasional wrong-word or sound-a-like substitutions may have occurred due to the inherent limitations of voice recognition software  This visit occurred during the SARS-CoV-2 public health emergency.  Safety protocols were in place, including screening questions prior to the visit, additional usage of staff PPE, and extensive cleaning of exam room while observing appropriate contact time as indicated for disinfecting solutions.

## 2021-02-14 ENCOUNTER — Encounter: Payer: Self-pay | Admitting: Allergy

## 2021-02-14 ENCOUNTER — Ambulatory Visit (INDEPENDENT_AMBULATORY_CARE_PROVIDER_SITE_OTHER): Payer: 59 | Admitting: Allergy

## 2021-02-14 ENCOUNTER — Other Ambulatory Visit: Payer: Self-pay

## 2021-02-14 VITALS — BP 124/98 | HR 84 | Resp 12 | Ht 60.0 in | Wt 157.2 lb

## 2021-02-14 DIAGNOSIS — R0602 Shortness of breath: Secondary | ICD-10-CM

## 2021-02-14 DIAGNOSIS — R0609 Other forms of dyspnea: Secondary | ICD-10-CM

## 2021-02-14 DIAGNOSIS — L299 Pruritus, unspecified: Secondary | ICD-10-CM | POA: Diagnosis not present

## 2021-02-14 DIAGNOSIS — U099 Post covid-19 condition, unspecified: Secondary | ICD-10-CM | POA: Diagnosis not present

## 2021-02-14 DIAGNOSIS — J3089 Other allergic rhinitis: Secondary | ICD-10-CM | POA: Diagnosis not present

## 2021-02-14 MED ORDER — MONTELUKAST SODIUM 10 MG PO TABS: 10.0000 mg | ORAL_TABLET | Freq: Every day | ORAL | 5 refills | Status: AC

## 2021-02-14 NOTE — Progress Notes (Signed)
New Patient Note  RE: [name] MRN: [id number] DOB: 09/10/1986 Date of Office Visit: 02/14/2021  Referring provider: Willow Ora, MD Primary care provider: Willow Ora, MD  Chief Complaint: allergies  History of present illness: Jillian Sanchez is a 35 y.o. female presenting today for consultation for allergies.  She states for a few months now she has been needing to clear her throat and make throat clearing nose that is triggered by right ear itch.  It is worse when laying down but does occur inside as well as outside of the home like at work.  She is not sure if there is some allergens triggering the symptoms.  She has not tried anything to help relieve the symptoms that she did not know what she could try.  She does report occasional itchy eyes as well as runny nose (like if she is out in the cold).  She did have Covid illness in January 2021 in reports still lingering shortness of breath.  She states even like walking from her car the parking lot into the building can cause her to feel short of breath.  She states that this point in time she has gotten kind of use to this issue.  She has not had any inhaler medications or anything else to see if the shortness of breath can be relieved.  No history of eczema, food allergy or asthma.     Review of systems: Review of Systems  Constitutional: Negative.   HENT:       See HPI  Eyes: Negative.   Respiratory: Negative for cough, sputum production and wheezing.        See HPI  Cardiovascular: Negative.   Gastrointestinal: Negative.   Musculoskeletal: Negative.   Skin: Negative.   Neurological: Negative.     All other systems negative unless noted above in HPI  Past medical history: Past Medical History:  Diagnosis Date  . High blood pressure   . Migraine, unspecified, without mention of intractable migraine without mention of status migrainosus 07/08/2013    Past surgical history: History reviewed. No pertinent  surgical history.  Family history:  Family History  Problem Relation Age of Onset  . Diabetes Mother   . High blood pressure Maternal Aunt   . Diabetes Maternal Grandmother     Social history: Lives in an apartment with carpeting with electric heating and central cooling.  No pets in the home.  There is no concern for water damage, mildew or roaches in the home.  She is a Curator.  She denies a smoking history.  Medication List: Current Outpatient Medications  Medication Sig Dispense Refill  . AUROVELA FE 1/20 1-20 MG-MCG tablet Take 1 tablet by mouth at bedtime.    . hydrochlorothiazide (HYDRODIURIL) 25 MG tablet Take 1 tablet (25 mg total) by mouth daily. 90 tablet 3  . metoprolol succinate (TOPROL-XL) 50 MG 24 hr tablet Take 1 tablet (50 mg total) by mouth daily. Take with or immediately following a meal. 90 tablet 3   No current facility-administered medications for this visit.    Known medication allergies: No Known Allergies   Physical examination: Blood pressure (!) 122/100, pulse 84, resp. rate 12, height 5' (1.524 m), weight 157 lb 3.2 oz (71.3 kg), SpO2 99 %.  General: Alert, interactive, in no acute distress. HEENT: PERRLA, TMs pearly gray, turbinates non-edematous without discharge, post-pharynx non erythematous. Neck: Supple without lymphadenopathy. Lungs: Clear to auscultation without wheezing, rhonchi or rales. {no increased  work of breathing. CV: Normal S1, S2 without murmurs. Abdomen: Nondistended, nontender. Skin: Warm and dry, without lesions or rashes. Extremities:  No clubbing, cyanosis or edema. Neuro:   Grossly intact.  Diagnositics/Labs: Spirometry: FEV1: 2.17L 94%, FVC: 2.44L 90%, ratio consistent with Nonobstructive pattern  Allergy testing: Environmental allergy skin prick testing is positive to both dust mites. Allergy testing results were read and interpreted by provider, documented by clinical staff.   Assessment and plan:    Environmental allergies -Environmental allergy skin testing is positive to dust mites -Allergen avoidance measures discussed/handouts provided -Recommend starting Singulair 10 mg daily at bedtime.  This is not an antihistamine.  Singulair helps in both allergy and respiratory symptom control. If you notice any change in mood/behavior/sleep after starting Singulair then stop this medication and let us know.  Symptoms resolve after stopping the medication.   -If needed you can take a long-acting antihistamine with Singulair.  Zyrtec, Allegra and Xyzal are all over-the-counter antihistamine options that you can add on for additional allergy symptom control -If medication management is not effective enough and you would be eligible for allergy immunotherapy which is a 45-year process that helps to retrain the body to become tolerant to dust mite so that when you have exposure you are less likely to be symptomatic in the need medications.  Shortness of breath -Prolonged symptoms after Covid illness -Lung function testing is normal -As above start Singulair. -If Singulair does not resolve symptoms then would recommend you have access to albuterol inhaler  Follow-up in 4 months or sooner if needed    No follow-ups on file.  I appreciate the opportunity to take part in Nuriya's care. Please do not hesitate to contact me with questions.  Sincerely,   Margo Aye, MD Allergy/Immunology [facility]

## 2021-02-14 NOTE — Patient Instructions (Addendum)
Environmental allergies -Environmental allergy skin testing is positive to dust mites -Allergen avoidance measures discussed/handouts provided -Recommend starting Singulair 10 mg daily at bedtime.  This is not an antihistamine.  Singulair helps in both allergy and respiratory symptom control. If you notice any change in mood/behavior/sleep after starting Singulair then stop this medication and let us know.  Symptoms resolve after stopping the medication.   -If needed you can take a long-acting antihistamine with Singulair.  Zyrtec, Allegra and Xyzal are all over-the-counter antihistamine options that you can add on for additional allergy symptom control -If medication management is not effective enough and you would be eligible for allergy immunotherapy which is a 3-5 year process that helps to retrain the body to become tolerant to dust mite so that when you have exposure you are less likely to be symptomatic in the need medications.  Shortness of breath -Prolonged symptoms after Covid illness -Lung function testing is normal -As above start Singulair. -If Singulair does not resolve symptoms then would recommend you have access to albuterol inhaler  Follow-up in 4 months or sooner if needed

## 2021-03-10 ENCOUNTER — Ambulatory Visit: Payer: 59 | Admitting: Family Medicine

## 2021-04-03 ENCOUNTER — Ambulatory Visit: Payer: 59 | Admitting: Family Medicine

## 2021-06-13 ENCOUNTER — Ambulatory Visit: Payer: 59 | Admitting: Allergy

## 2022-09-24 ENCOUNTER — Encounter: Payer: Self-pay | Admitting: *Deleted

## 2022-12-13 ENCOUNTER — Encounter: Payer: Self-pay | Admitting: *Deleted
